# Patient Record
Sex: Female | Born: 2000 | Race: White | Hispanic: No | Marital: Single | Smoking: Never smoker
Health system: Southern US, Community
[De-identification: ages and names within clinical notes are randomized; demographics above are authoritative.]

## PROBLEM LIST (undated history)

## (undated) DIAGNOSIS — T7840XA Allergy, unspecified, initial encounter: Secondary | ICD-10-CM

## (undated) HISTORY — DX: Allergy, unspecified, initial encounter: T78.40XA

---
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
## 2018-03-06 ENCOUNTER — Telehealth: Payer: Self-pay | Admitting: General Practice

## 2018-03-06 NOTE — Telephone Encounter (Signed)
Pt will need 2nd Meningitis, Bexsero (Men B), and I would strongly recommend the HPV series (although this is likely not required for school)

## 2018-03-06 NOTE — Telephone Encounter (Signed)
Spoke with pt mom and informed they will look into gardasil series. Appt scheduled for 03/11/18 for meningitis and bexsero

## 2018-03-06 NOTE — Telephone Encounter (Signed)
Last OV 06/13/17  Looks as though pt is due for 2nd meningitis vaccine and bexsero. Pt has only had 3 pneumo vaccines and no HPV.   Please advise?   Copied from CRM [phone]. Topic: Inquiry >> Mar 06, 2018 11:40 AM Windy KalataMichael, Taylor L, NT wrote: Reason for CRM: patient mother is calling and states patient is going to college in august and would like to know if she is up to date or which vaccine she needs.

## 2018-03-12 ENCOUNTER — Ambulatory Visit: Payer: Managed Care, Other (non HMO)

## 2018-03-18 ENCOUNTER — Ambulatory Visit (INDEPENDENT_AMBULATORY_CARE_PROVIDER_SITE_OTHER): Payer: Managed Care, Other (non HMO) | Admitting: General Practice

## 2018-03-18 DIAGNOSIS — Z23 Encounter for immunization: Secondary | ICD-10-CM | POA: Diagnosis not present

## 2018-03-18 NOTE — Progress Notes (Signed)
CMA note reviewed.   Kjersti Dittmer Cody Patches Mcdonnell, PA-C  

## 2018-03-18 NOTE — Progress Notes (Signed)
Alona Benelexis N Matassa is a 17 y.o. female presents to the office today for Meningitis and Bexsero injections, per physician's orders. Original order: from Dr. Donnal Moatabori Menveo (med), 0.115mL(dose),  IM (route) was administered Right Deltoid (location) today.  Bexsero (med), 0.185mL(dose),  IM (route) was administered Left Deltoid (location) today. Patient tolerated injections. Patient next injection due: 6 months, appt made no; pt going to college will schedule when she knows her scheduled.   Raylin Winer L Blessings Inglett

## 2018-07-13 ENCOUNTER — Other Ambulatory Visit: Payer: Self-pay | Admitting: Family Medicine

## 2018-09-11 ENCOUNTER — Other Ambulatory Visit: Payer: Self-pay

## 2018-09-11 ENCOUNTER — Ambulatory Visit: Payer: Managed Care, Other (non HMO) | Admitting: Family Medicine

## 2018-09-11 ENCOUNTER — Encounter: Payer: Self-pay | Admitting: Family Medicine

## 2018-09-11 VITALS — BP 102/56 | HR 98 | Temp 98.1°F | Resp 14 | Ht 61.0 in | Wt 127.0 lb

## 2018-09-11 DIAGNOSIS — N921 Excessive and frequent menstruation with irregular cycle: Secondary | ICD-10-CM

## 2018-09-11 DIAGNOSIS — Z3041 Encounter for surveillance of contraceptive pills: Secondary | ICD-10-CM | POA: Diagnosis not present

## 2018-09-11 LAB — CBC WITH DIFFERENTIAL/PLATELET
Absolute Monocytes: 705 cells/uL (ref 200–950)
BASOS ABS: 73 {cells}/uL (ref 0–200)
Basophils Relative: 0.9 %
EOS ABS: 243 {cells}/uL (ref 15–500)
Eosinophils Relative: 3 %
HCT: 38.1 % (ref 35.0–45.0)
Hemoglobin: 13.2 g/dL (ref 11.7–15.5)
LYMPHS ABS: 1531 {cells}/uL (ref 850–3900)
MCH: 31.5 pg (ref 27.0–33.0)
MCHC: 34.6 g/dL (ref 32.0–36.0)
MCV: 90.9 fL (ref 80.0–100.0)
MONOS PCT: 8.7 %
MPV: 9.8 fL (ref 7.5–12.5)
NEUTROS ABS: 5549 {cells}/uL (ref 1500–7800)
Neutrophils Relative %: 68.5 %
PLATELETS: 303 10*3/uL (ref 140–400)
RBC: 4.19 10*6/uL (ref 3.80–5.10)
RDW: 11.5 % (ref 11.0–15.0)
Total Lymphocyte: 18.9 %
WBC: 8.1 10*3/uL (ref 3.8–10.8)

## 2018-09-11 MED ORDER — NORETHINDRONE-ETH ESTRADIOL 1-35 MG-MCG PO TABS: 1.0000 | ORAL_TABLET | Freq: Every day | ORAL | 11 refills | Status: DC

## 2018-09-11 NOTE — Progress Notes (Signed)
Subjective  CC:  Chief Complaint  Patient presents with  . Contraception    Is currently taking Junel, has been having irreguler cycles. States that she has been having 2 cycles a month   Same day acute visit; PCP not available. New pt to me. Chart reviewed.   HPI: Emily Hebert is a 18 y.o. female who presents to the office today to address the problems listed above in the chief complaint.  18 year old female currently on low estrogen birth control for just over a year.  Upon initiation she did have a few irregular menstrual cycles, but that it regulated itself.  Over the last 3 to 6 months she reports intermittent irregular cycles.  Having an extra.  Typically within the first 2 weeks of starting a new pack.  No dysmenorrhea.  No vaginal discharge.  Compliance with the pill is good.  She reports she had heavy periods prior to starting birth control.  She is due for complete physical Assessment  1. Oral contraceptive use   2. Breakthrough bleeding on birth control pills      Plan   Oral contraceptive use, low-dose estrogen combined contraception with breakthrough bleeding in first half of cycle: Recommend starting a stronger estrogen pill.  Ortho-Novum 1/35 prescribed.  Education given.  See after visit summary.  Recheck in 3 months with PCP.  Check for anemia and start over-the-counter iron if indicated.  Education given to patient.  Follow up: Return in about 3 months (around 12/11/2018) for recheck birth control.  Visit date not found  Orders Placed This Encounter  Procedures  . CBC with Differential/Platelet   Meds ordered this encounter  Medications  . norethindrone-ethinyl estradiol 1/35 (ORTHO-NOVUM 1/35, 28,) tablet    Sig: Take 1 tablet by mouth daily.    Dispense:  1 Package    Refill:  11      I reviewed the patients updated PMH, FH, and SocHx.    Patient Active Problem List   Diagnosis Date Noted  . Oral contraceptive use 09/11/2018  . Physical exam  06/13/2017   Current Meds  Medication Sig  . Ascorbic Acid (VITAMIN C PO) Take by mouth.  Marland Kitchen. BIOTIN PO Take by mouth.  . Probiotic Product (PROBIOTIC ADVANCED PO) Take by mouth.  . [DISCONTINUED] JUNEL FE 1/20 1-20 MG-MCG tablet TAKE 1 TABLET BY MOUTH EVERY DAY    Allergies: Patient has No Known Allergies. Family History: Patient family history includes Healthy in her mother. Social History:  Patient  reports that she has never smoked. She has never used smokeless tobacco. She reports that she does not drink alcohol or use drugs.  Review of Systems: Constitutional: Negative for fever malaise or anorexia Cardiovascular: negative for chest pain Respiratory: negative for SOB or persistent cough Gastrointestinal: negative for abdominal pain  Objective  Vitals: BP (!) 102/56   Pulse 98   Temp 98.1 F (36.7 C) (Oral)   Resp 14   Ht 5\' 1"  (1.549 m)   Wt 127 lb (57.6 kg)   LMP 09/03/2018   SpO2 99%   BMI 24.00 kg/m  General: no acute distress , A&Ox3 HEENT: PEERL, conjunctiva normal, Oropharynx moist,neck is supple Cardiovascular:  RRR without murmur or gallop.  Respiratory:  Good breath sounds bilaterally, CTAB with normal respiratory effort Skin:  Warm, no rashes     Commons side effects, risks, benefits, and alternatives for medications and treatment plan prescribed today were discussed, and the patient expressed understanding of the given instructions.  Patient is instructed to call or message via MyChart if he/she has any questions or concerns regarding our treatment plan. No barriers to understanding were identified. We discussed Red Flag symptoms and signs in detail. Patient expressed understanding regarding what to do in case of urgent or emergency type symptoms.   Medication list was reconciled, printed and provided to the patient in AVS. Patient instructions and summary information was reviewed with the patient as documented in the AVS. This note was prepared with  assistance of Dragon voice recognition software. Occasional wrong-word or sound-a-like substitutions may have occurred due to the inherent limitations of voice recognition software

## 2018-09-11 NOTE — Patient Instructions (Addendum)
Please return in 3 months for Recheck on birth control with Dr. Beverely Low.  Also due for your physical.   I have ordered a new birth control pill for you to take.  Start it when you complete this one.  It has more estrogen in it so it should do a better job for you.   Starting Your Oral Contraception:  1) First Day Start - Take your first pill during the first 24 hours of your menstrual cycle. No back-up contraceptivemethod is needed when the pill is started the first day of your menses.  Recommended option: 2) Sunday Start - Wait until the first Sunday after your menstrual cycle begins to take your first pill. With this optionuse another method of birth control for the first 14 days of the first cycle only.  3) "Quick Start" - Start the pill today. If you have had unprotected sexual intercourse since your last period, perform a pregnancy test prior to starting the pill. If it is negative, start the pill today. Use another method of birth control such as condoms or spermicide the first seven days of the first cycle of use.   Oral Contraception Answers to Common Questions.   1)  Take your birth control pill at the same time every day. This ensures that a constant hormone level is maintained at all times.  2) Should you experience nausea or vomiting, try taking your pill after a meal or at bedtime.  3)  Irregular vaginal bleeding or spotting is common while starting the pill, especially during the first few months of oral contraceptive use. It should resolve by 6 months.   4) Birth control pills do not protect you from sexually transmitted infections.   INSTRUCTIONS FOR MISSED PILLS: 1 active pill < 24 hours late in any week: Take 1 active pill ASAP* and continue pack as usual.  Missed 1 active pills (i.e., >24 hours late):  Take 1 active pill ASAP* and continue pack as usual, (take 2nd pill that day) Back-up contraception for 7 days. Consider Emergency Contraception (EC) if unprotected  intercourse occurred within the  5 days prior to missing pill.  If missed more than 2 pills, throw away pack and start new pack on Sunday; Take a pregnancy test prior to starting new pack.    MEDICATIONS AND BIRTH CONTROL PILLS: The following medications and supplements may interfere with the effectiveness of CHC:  some antibiotics  anticonvulsants  St. John's Wort  Provigil  It is recommend that if you take the above medications and supplements and are sexually active with a female partner, you use a back-up method of birth control while using the medication and for 7 consecutive days once the medication is completed.   IF YOU EXPERIENCE ANY OF THE FOLLOWING, PLEASE CALL IMMEDIATELY OR GO TO THE EMERGENCY ROOM:   severe abdominal pain or tenderness in the lower abdomen  chest pain, sharp, sudden shortness of breath or coughing up blood  headache, severe and sudden, or vomiting, dizziness or faintness  eyesight problems, such as sudden blurred or doubled vision or flashes of light  severe pain or swelling in calf or groin   If you have any questions or concerns, please don't hesitate to send me a message via MyChart or call the office at [phone]. Thank you for visiting with Emily Hebert today! It's our pleasure caring for you.  We will call you with the lab results.    Iron-Rich Diet  Iron is a mineral that helps your  body to produce hemoglobin. Hemoglobin is a protein in red blood cells that carries oxygen to your body's tissues. Eating too little iron may cause you to feel weak and tired, and it can increase your risk of infection. Iron is naturally found in many foods, and many foods have iron added to them (iron-fortified foods). You may need to follow an iron-rich diet if you do not have enough iron in your body due to certain medical conditions. The amount of iron that you need each day depends on your age, your sex, and any medical conditions you have. Follow instructions from  your health care provider or a diet and nutrition specialist (dietitian) about how much iron you should eat each day. What are tips for following this plan? Reading food labels  Check food labels to see how many milligrams (mg) of iron are in each serving. Cooking  Cook foods in pots and pans that are made from iron.  Take these steps to make it easier for your body to absorb iron from certain foods: ? Soak beans overnight before cooking. ? Soak whole grains overnight and drain them before using. ? Ferment flours before baking, such as by using yeast in bread dough. Meal planning  When you eat foods that contain iron, you should eat them with foods that are high in vitamin C. These include oranges, peppers, tomatoes, potatoes, and mango. Vitamin C helps your body to absorb iron. General information  Take iron supplements only as told by your health care provider. An overdose of iron can be life-threatening. If you were prescribed iron supplements, take them with orange juice or a vitamin C supplement.  When you eat iron-fortified foods or take an iron supplement, you should also eat foods that naturally contain iron, such as meat, poultry, and fish. Eating naturally iron-rich foods helps your body to absorb the iron that is added to other foods or contained in a supplement.  Certain foods and drinks prevent your body from absorbing iron properly. Avoid eating these foods in the same meal as iron-rich foods or with iron supplements. These foods include: ? Coffee, black tea, and red wine. ? Milk, dairy products, and foods that are high in calcium. ? Beans and soybeans. ? Whole grains. What foods should I eat? Fruits Prunes. Raisins. Eat fruits high in vitamin C, such as oranges, grapefruits, and strawberries, alongside iron-rich foods. Vegetables Spinach (cooked). Green peas. Broccoli. Fermented vegetables. Eat vegetables high in vitamin C, such as leafy greens, potatoes, bell peppers,  and tomatoes, alongside iron-rich foods. Grains Iron-fortified breakfast cereal. Iron-fortified whole-wheat bread. Enriched rice. Sprouted grains. Meats and other proteins Beef liver. Oysters. Beef. Shrimp. [place]. Chicken. Tuna. Sardines. Chickpeas. Nuts. Tofu. Pumpkin seeds. Beverages Tomato juice. Fresh orange juice. Prune juice. Hibiscus tea. Fortified instant breakfast shakes. Sweets and desserts Blackstrap molasses. Seasonings and condiments Tahini. Fermented soy sauce. Other foods Wheat germ. The items listed above may not be a complete list of recommended foods and beverages. Contact a dietitian for more information. What foods should I avoid? Grains Whole grains. Bran cereal. Bran flour. Oats. Meats and other proteins Soybeans. Products made from soy protein. Black beans. Lentils. Mung beans. Split peas. Dairy Milk. Cream. Cheese. Yogurt. Cottage cheese. Beverages Coffee. Black tea. Red wine. Sweets and desserts Cocoa. Chocolate. Ice cream. Other foods Basil. Oregano. Large amounts of parsley. The items listed above may not be a complete list of foods and beverages to avoid. Contact a dietitian for more information. Summary  Iron  is a mineral that helps your body to produce hemoglobin. Hemoglobin is a protein in red blood cells that carries oxygen to your body's tissues.  Iron is naturally found in many foods, and many foods have iron added to them (iron-fortified foods).  When you eat foods that contain iron, you should eat them with foods that are high in vitamin C. Vitamin C helps your body to absorb iron.  Certain foods and drinks prevent your body from absorbing iron properly, such as whole grains and dairy products. You should avoid eating these foods in the same meal as iron-rich foods or with iron supplements. This information is not intended to replace advice given to you by your health care provider. Make sure you discuss any questions you have with your health  care provider. Document Released: 03/26/2005 Document Revised: 07/08/2017 Document Reviewed: 07/08/2017 Elsevier Interactive Patient Education  2019 [employer]

## 2018-09-14 ENCOUNTER — Ambulatory Visit: Payer: Managed Care, Other (non HMO) | Admitting: Family Medicine

## 2018-09-14 ENCOUNTER — Encounter: Payer: Self-pay | Admitting: *Deleted

## 2018-09-14 NOTE — Progress Notes (Signed)
Please call patient: I have reviewed his/her lab results. Please let her know that she is not anemic. Her blood work is normal. Follow up as directed.

## 2018-10-11 ENCOUNTER — Other Ambulatory Visit: Payer: Self-pay | Admitting: Family Medicine

## 2018-12-11 ENCOUNTER — Ambulatory Visit: Payer: Managed Care, Other (non HMO) | Admitting: Family Medicine

## 2019-02-16 ENCOUNTER — Telehealth: Payer: Self-pay | Admitting: Family Medicine

## 2019-02-16 NOTE — Telephone Encounter (Signed)
Pt has been scheduled for Monday per her request.

## 2019-02-16 NOTE — Telephone Encounter (Signed)
Pt called in stating that she is having some stomach issues. She is states that she is having reflux issues and some blood in her stool and some rectal discharged that has been going on for about 3wks.   She has passed the COVID screening.   Should this be an in office visit?

## 2019-02-16 NOTE — Telephone Encounter (Signed)
Ok for her to come in

## 2019-02-16 NOTE — Telephone Encounter (Signed)
Pt will need in office appt with either me or Einar Pheasant

## 2019-02-22 ENCOUNTER — Encounter: Payer: Self-pay | Admitting: Family Medicine

## 2019-02-22 ENCOUNTER — Other Ambulatory Visit: Payer: Self-pay

## 2019-02-22 ENCOUNTER — Ambulatory Visit (INDEPENDENT_AMBULATORY_CARE_PROVIDER_SITE_OTHER): Payer: Managed Care, Other (non HMO) | Admitting: Family Medicine

## 2019-02-22 VITALS — BP 101/70 | HR 107 | Temp 97.9°F | Resp 17 | Ht 61.0 in | Wt 129.1 lb

## 2019-02-22 DIAGNOSIS — K219 Gastro-esophageal reflux disease without esophagitis: Secondary | ICD-10-CM | POA: Diagnosis not present

## 2019-02-22 DIAGNOSIS — R198 Other specified symptoms and signs involving the digestive system and abdomen: Secondary | ICD-10-CM | POA: Diagnosis not present

## 2019-02-22 DIAGNOSIS — K625 Hemorrhage of anus and rectum: Secondary | ICD-10-CM | POA: Diagnosis not present

## 2019-02-22 MED ORDER — HYDROCORTISONE ACETATE 25 MG RE SUPP: 25.0000 mg | Freq: Two times a day (BID) | RECTAL | 0 refills | Status: DC

## 2019-02-22 MED ORDER — FAMOTIDINE 40 MG PO TABS: 40.0000 mg | ORAL_TABLET | Freq: Every day | ORAL | 3 refills | Status: DC

## 2019-02-22 NOTE — Patient Instructions (Signed)
Follow up by phone or MyChart in 1-2 weeks and let me know how things are going START the Famotidine nightly to help w/ the reflux USE the hydrocortisone rectally to help w/ the bleeding and drainage Keep track of your symptoms- when they occur, how frequently, etc Call with any questions or concerns Hang in there!! Stay Safe!!!

## 2019-02-22 NOTE — Progress Notes (Signed)
   Subjective:    Patient ID: Emily Hebert, female    DOB: May 10, 2001, 18 y.o.   MRN: [id number]  HPI Rectal bleeding/discharge- sxs started ~1 month ago.  Discharge was either brown or clear.  Now appears to be mucousy.  No odor.  Bleeding started around the same time and is occurring 'a couple of times a week'.  Bleeding can occur w/ or w/o BM.  Has never had bleeding in underwear- only when going to the bathroom.  Denies constipation but has had to strain for BMs.  Reports daily, 'regular' BMs.  Some intermittent diarrhea.  Reports good water intake.  No changes to diet or medications.  No anal intercourse.  Recently dairy has caused increased stomach issues.  Some abd cramping.  D/c occurring daily.  GERD- pt reports sxs started 'over a year ago'.  Certain foods will trigger this as will exercise or overheating.  Has taken OTC chewable reflux meds.  Occurring regularly.   Review of Systems For ROS see HPI     Objective:   Physical Exam Vitals signs reviewed. Exam conducted with a chaperone present.  Constitutional:      General: She is not in acute distress.    Appearance: Normal appearance. She is not ill-appearing.  Genitourinary:    Rectum: No mass, tenderness, anal fissure, external hemorrhoid or internal hemorrhoid. Normal anal tone.  Neurological:     Mental Status: She is alert.           Assessment & Plan:  GERD- pt is having fairly regular sxs.  Based on this, will start nightly Famotidine and monitor for improvement.  Rectal discharge- occurring daily.  Suspect she has an internal hemorrhoid that I am not able to palpate that is keeping the anus open just enough to allow leakage.  Will treat as if hemorrhoid is present and if no improvement will need GI referral.  Pt expressed understanding and is in agreement w/ plan.   Rectal bleeding- typically occurring w/ BMs.  Pt denies pain.  Reports she does have to strain at times.  Again, suspect this is a hemorrhoid.  No  palpable mass or area of concern.  Treat w/ rectal hydrocortisone.  If no improvement will need GI referral.  Pt expressed understanding and is in agreement w/ plan.

## 2019-03-19 ENCOUNTER — Telehealth: Payer: Self-pay | Admitting: *Deleted

## 2019-03-19 DIAGNOSIS — K625 Hemorrhage of anus and rectum: Secondary | ICD-10-CM

## 2019-03-19 NOTE — Telephone Encounter (Signed)
[facility] could not see patient until [facility] August, So I contacted [facility] (Dr. Liliane Channel office) and they can see patient on Tuesday the 28th at 1:30. Information has been sent to them, patient has been notified.

## 2019-03-19 NOTE — Telephone Encounter (Signed)
I placed this as an urgent referral.

## 2019-03-19 NOTE — Telephone Encounter (Signed)
[facility] for referral to GI for rectal bleeding.  Please note that pt is returning to school in a few weeks to try and get her in

## 2019-03-19 NOTE — Addendum Note (Signed)
Addended by: Katina Dung on: 03/19/2019 02:37 PM   Modules accepted: Orders

## 2019-03-19 NOTE — Telephone Encounter (Signed)
Patient's mom called in to say that pt had another round of rectal bleeding this week. It was just one day - but they were concerned.  Patient goes back to school on 8/17 and they would really like to see someone about this prior to that if possible.  She is asking if they need to be referred to a specialist so that they can get in quickly before going back to school   Routing to PCP to advise  CB: [phone]

## 2019-03-19 NOTE — Addendum Note (Signed)
Addended by: Davis Gourd on: 03/19/2019 01:42 PM   Modules accepted: Orders

## 2019-05-16 ENCOUNTER — Other Ambulatory Visit: Payer: Self-pay | Admitting: Family Medicine

## 2019-08-12 ENCOUNTER — Other Ambulatory Visit: Payer: Self-pay | Admitting: Family Medicine

## 2019-08-12 NOTE — Telephone Encounter (Signed)
Will refill but pt needs to schedule CPE.  She is overdue

## 2019-08-12 NOTE — Telephone Encounter (Signed)
Dr. Tabori's patient 

## 2019-08-16 ENCOUNTER — Encounter: Payer: Self-pay | Admitting: Family Medicine

## 2019-10-06 ENCOUNTER — Encounter: Payer: Self-pay | Admitting: Family Medicine

## 2019-10-06 ENCOUNTER — Telehealth: Payer: Self-pay | Admitting: General Practice

## 2019-10-06 ENCOUNTER — Other Ambulatory Visit: Payer: Self-pay

## 2019-10-06 ENCOUNTER — Ambulatory Visit (INDEPENDENT_AMBULATORY_CARE_PROVIDER_SITE_OTHER): Payer: Managed Care, Other (non HMO) | Admitting: Family Medicine

## 2019-10-06 DIAGNOSIS — N912 Amenorrhea, unspecified: Secondary | ICD-10-CM | POA: Diagnosis not present

## 2019-10-06 NOTE — Progress Notes (Signed)
   Virtual Visit via Video   I connected with patient on 10/06/19 at  3:00 PM EST by a video enabled telemedicine application and verified that I am speaking with the correct person using two identifiers.  Location patient: Home Location provider: [facility], Office Persons participating in the virtual visit: Patient, Provider, CMA (Jess B)  I discussed the limitations of evaluation and management by telemedicine and the availability of in person appointments. The patient expressed understanding and agreed to proceed.  Subjective:   HPI:   Amenorrhea- pt has not had period since November.  Not sexually active.  Stopped OCPs in late January.  Denies increased stress.  No changes to diet or exercise routine.  No new meds or supplements.  No weight loss or gain recently.  Pt has gained 10 lbs of muscle since September and does CrossFit regularly.  ROS:   See pertinent positives and negatives per HPI.  Patient Active Problem List   Diagnosis Date Noted  . Oral contraceptive use 09/11/2018  . Physical exam 06/13/2017    Social History   Tobacco Use  . Smoking status: Never Smoker  . Smokeless tobacco: Never Used  Substance Use Topics  . Alcohol use: No    Current Outpatient Medications:  .  Ascorbic Acid (VITAMIN C PO), Take by mouth., Disp: , Rfl:  .  famotidine (PEPCID) 40 MG tablet, TAKE 1 TABLET BY MOUTH EVERYDAY AT BEDTIME, Disp: 90 tablet, Rfl: 1 .  hydrocortisone (ANUSOL-HC) 25 MG suppository, Place 1 suppository (25 mg total) rectally 2 (two) times daily., Disp: 12 suppository, Rfl: 0 .  Probiotic Product (PROBIOTIC ADVANCED PO), Take by mouth., Disp: , Rfl:  .  ALAYCEN 1/35 tablet, TAKE 1 TABLET BY MOUTH EVERY DAY (Patient not taking: Reported on 10/06/2019), Disp: 84 tablet, Rfl: 0 .  BIOTIN PO, Take by mouth., Disp: , Rfl:   No Known Allergies  Objective:   There were no vitals taken for this visit. AAOx3, NAD NCAT, EOMI No obvious CN  deficits Coloring WNL Pt is able to speak clearly, coherently without shortness of breath or increased work of breathing.  Thought process is linear.  Mood is appropriate.   Assessment and Plan:   Amenorrhea- new.  Given that she is quite active in CrossFit and has gained 10 lbs of muscle on her small frame (5'1") I suspect her lack of periods over the last 2 months is due to her low body fat percentage and not enough caloric intake to support the increased muscle mass.  She is not under considerable emotional stress.  Has no chance of pregnancy due to not being sexually active in the last year.  Encouraged her to liberalize her fat intake and increase her calories to support her increased muscle mass.  If symptoms fail to improve over the next few months, may need to see Endocrinologist.  Spent 30 minutes reviewing sxs, possible causes, and possible treatments w/ pt, >50% spent counseling.   Neena Rhymes, MD 10/06/2019

## 2019-10-06 NOTE — Progress Notes (Signed)
I have discussed the procedure for the virtual visit with the patient who has given consent to proceed with assessment and treatment.   Pt unable to obtain vitals.   Pt states she is not sexually active in over a year, stopped BC in January.  Geannie Risen, CMA

## 2019-10-06 NOTE — Telephone Encounter (Signed)
Called pt to ask some questions in regards to her appt this afternoon.

## 2019-10-11 ENCOUNTER — Telehealth: Payer: Self-pay | Admitting: Family Medicine

## 2019-10-11 NOTE — Telephone Encounter (Signed)
Pt needs a f/up appt with Tabori in Late March/Early April to f/up on Menstrual cycle. Last appt was 10/06/2019

## 2019-11-01 ENCOUNTER — Other Ambulatory Visit: Payer: Self-pay | Admitting: Family Medicine

## 2020-03-31 ENCOUNTER — Encounter: Payer: Self-pay | Admitting: Physician Assistant

## 2020-03-31 ENCOUNTER — Ambulatory Visit: Payer: Managed Care, Other (non HMO) | Admitting: Physician Assistant

## 2020-03-31 ENCOUNTER — Other Ambulatory Visit: Payer: Self-pay

## 2020-03-31 DIAGNOSIS — R000 Tachycardia, unspecified: Secondary | ICD-10-CM

## 2020-03-31 LAB — COMPREHENSIVE METABOLIC PANEL
ALT: 8 U/L (ref 0–35)
AST: 22 U/L (ref 0–37)
Albumin: 4.6 g/dL (ref 3.5–5.2)
Alkaline Phosphatase: 56 U/L (ref 39–117)
BUN: 11 mg/dL (ref 6–23)
CO2: 29 mEq/L (ref 19–32)
Calcium: 9.7 mg/dL (ref 8.4–10.5)
Chloride: 105 mEq/L (ref 96–112)
Creatinine, Ser: 0.77 mg/dL (ref 0.40–1.20)
GFR: 94.81 mL/min (ref >60.00)
Glucose, Bld: 79 mg/dL (ref 70–99)
Potassium: 4 mEq/L (ref 3.5–5.1)
Sodium: 139 mEq/L (ref 135–145)
Total Bilirubin: 0.6 mg/dL (ref 0.2–1.2)
Total Protein: 6.9 g/dL (ref 6.0–8.3)

## 2020-03-31 LAB — CBC WITH DIFFERENTIAL/PLATELET
Basophils Absolute: 0 10*3/uL (ref 0.0–0.1)
Basophils Relative: 0.8 % (ref 0.0–3.0)
Eosinophils Absolute: 0.3 10*3/uL (ref 0.0–0.7)
Eosinophils Relative: 5.8 % — ABNORMAL HIGH (ref 0.0–5.0)
HCT: 41.4 % (ref 36.0–46.0)
Hemoglobin: 13.9 g/dL (ref 12.0–15.0)
Lymphocytes Relative: 22.3 % (ref 12.0–46.0)
Lymphs Abs: 1.3 10*3/uL (ref 0.7–4.0)
MCHC: 33.6 g/dL (ref 30.0–36.0)
MCV: 95 fl (ref 78.0–100.0)
Monocytes Absolute: 0.6 10*3/uL (ref 0.1–1.0)
Monocytes Relative: 10.5 % (ref 3.0–12.0)
Neutro Abs: 3.4 10*3/uL (ref 1.4–7.7)
Neutrophils Relative %: 60.6 % (ref 43.0–77.0)
Platelets: 245 10*3/uL (ref 150.0–400.0)
RBC: 4.36 Mil/uL (ref 3.87–5.11)
RDW: 13.1 % (ref 11.5–14.6)
WBC: 5.6 10*3/uL (ref 4.5–10.5)

## 2020-03-31 LAB — TSH: TSH: 1.47 u[IU]/mL (ref 0.35–5.50)

## 2020-03-31 NOTE — Patient Instructions (Signed)
Please keep well-hydrated. Make sure to keep an electrolyte water with you during workouts.  Don't push it too hard until we get things checked out.  Avoid alcohol and caffeine.   Please go to the lab today for blood work.  I will call you with your results. We will alter treatment regimen(s) if indicated by your results.   Hang in there!

## 2020-03-31 NOTE — Progress Notes (Signed)
Patient presents to clinic today c/o ongoing issue with elevated resting heart rate and issue with significantly elevated heart rate with exertion.  Patient states she began noticing this in August 2020 but has become more significant since then.  Patient states she started doing CrossFit about a year ago.  Has been able to be very active with this.  Says even though her heart rate will get up to the 190s or 200 she denies any chest pain or shortness of breath.  Will however note some fatigue and states she feels better overall and has to cut back on activity level.  Patient denies any leg swelling, PND orthopnea.  Denies any chest tightness or wheezing.  Denies any personal history of known iron deficiency or other vitamin deficiency.  Does have family history of hypothyroidism.  Patient denies any increased anxiety, weight loss or change in stool.  Endorses keeping well-hydrated and does follow a well-balanced diet.  Notes she recently increased her caloric intake at the recommendation of her primary care provider.    Past Medical History:  Diagnosis Date  . Allergy     Current Outpatient Medications on File Prior to Visit  Medication Sig Dispense Refill  . Ascorbic Acid (VITAMIN C PO) Take by mouth.    . cetirizine (ZYRTEC) 10 MG tablet Take 10 mg by mouth daily.    Marland Kitchen zinc gluconate 50 MG tablet Take 50 mg by mouth daily.     No current facility-administered medications on file prior to visit.    No Known Allergies  Family History  Problem Relation Age of Onset  . Healthy Mother   . GER disease Maternal Grandmother     Social History   Socioeconomic History  . Marital status: Single    Spouse name: Not on file  . Number of children: Not on file  . Years of education: Not on file  . Highest education level: Not on file  Occupational History  . Not on file  Tobacco Use  . Smoking status: Never Smoker  . Smokeless tobacco: Never Used  Vaping Use  . Vaping Use: Never used    Substance and Sexual Activity  . Alcohol use: No  . Drug use: No  . Sexual activity: Not Currently  Other Topics Concern  . Not on file  Social History Narrative  . Not on file   Social Determinants of Health   Financial Resource Strain:   . Difficulty of Paying Living Expenses:   Food Insecurity:   . Worried About Charity fundraiser in the Last Year:   . Arboriculturist in the Last Year:   Transportation Needs:   . Film/video editor (Medical):   Marland Kitchen Lack of Transportation (Non-Medical):   Physical Activity:   . Days of Exercise per Week:   . Minutes of Exercise per Session:   Stress:   . Feeling of Stress :   Social Connections:   . Frequency of Communication with Friends and Family:   . Frequency of Social Gatherings with Friends and Family:   . Attends Religious Services:   . Active Member of Clubs or Organizations:   . Attends Archivist Meetings:   Marland Kitchen Marital Status:    Review of Systems - See HPI.  All other ROS are negative.  BP 90/68   Pulse 92   Temp 98.3 F (36.8 C) (Temporal)   Resp 16   Ht 5' 1"  (1.549 m)   Wt 136 lb (  61.7 kg)   SpO2 99%   BMI 25.70 kg/m   Physical Exam Vitals reviewed.  Constitutional:      Appearance: Normal appearance.  HENT:     Head: Normocephalic and atraumatic.  Eyes:     Conjunctiva/sclera: Conjunctivae normal.     Pupils: Pupils are equal, round, and reactive to light.  Neck:     Thyroid: No thyroid mass, thyromegaly or thyroid tenderness.  Cardiovascular:     Rate and Rhythm: Normal rate and regular rhythm.     Pulses: Normal pulses.     Heart sounds: Normal heart sounds.  Pulmonary:     Effort: Pulmonary effort is normal.     Breath sounds: Normal breath sounds.  Musculoskeletal:     Cervical back: Neck supple.  Lymphadenopathy:     Cervical: No cervical adenopathy.  Neurological:     General: No focal deficit present.     Mental Status: She is alert and oriented to person, place, and time.   Psychiatric:        Mood and Affect: Mood normal.     Assessment/Plan: 1. Tachycardia Same she has a robust response in heart rate to very little exercise/exertion.  This was noted when she moved from chair to examination table.  Examination otherwise unremarkable.  Blood pressure always runs 53Y systolic.  Concern for potential for mild anemia that is contributing to symptoms.  Needs further assessment.  Will check CBC, iron panel, TSH and metabolic panel today.  We will have her avoid caffeine and alcohol.  Increase fluids.  Recommend electrolyte water when exercising.  If labs are unremarkable we will have her have an assessment with specialist. - CBC w/Diff - Iron, TIBC and Ferritin Panel - TSH - Comp Met (CMET)  This visit occurred during the SARS-CoV-2 public health emergency.  Safety protocols were in place, including screening questions prior to the visit, additional usage of staff PPE, and extensive cleaning of exam room while observing appropriate contact time as indicated for disinfecting solutions.     Leeanne Rio, PA-C

## 2020-04-01 LAB — IRON,TIBC AND FERRITIN PANEL
%SAT: 27 % (calc) (ref 16–45)
Ferritin: 26 ng/mL (ref 16–154)
Iron: 105 ug/dL (ref 40–190)
TIBC: 382 mcg/dL (calc) (ref 250–450)

## 2020-04-03 ENCOUNTER — Other Ambulatory Visit: Payer: Self-pay

## 2020-04-03 ENCOUNTER — Telehealth: Payer: Self-pay | Admitting: Family Medicine

## 2020-04-03 DIAGNOSIS — R000 Tachycardia, unspecified: Secondary | ICD-10-CM

## 2020-04-03 NOTE — Telephone Encounter (Signed)
Ok to change referral to [facility]

## 2020-04-03 NOTE — Telephone Encounter (Signed)
Patient states she was referred to [facility] on [address].   Patient states she has moved to [facility].  Patient prefers to be seen closer to [place].     She is requesting a referral to be sent to:  [facility] [address] [place], [place] [address] Phone: [phone] Fax: [phone]

## 2020-04-03 NOTE — Telephone Encounter (Signed)
New referral placed.

## 2020-04-03 NOTE — Progress Notes (Signed)
cardil

## 2020-04-03 NOTE — Telephone Encounter (Signed)
Emily Hebert saw pt and placed referral. Ok to change?

## 2020-04-05 ENCOUNTER — Ambulatory Visit: Payer: Managed Care, Other (non HMO) | Admitting: Cardiology
